# Patient Record
Sex: Female | Born: 2005 | Hispanic: Yes | Marital: Single | Smoking: Never smoker
Health system: Southern US, Community
[De-identification: ages and names within clinical notes are randomized; demographics above are authoritative.]

## PROBLEM LIST (undated history)

## (undated) DIAGNOSIS — Z558 Other problems related to education and literacy: Secondary | ICD-10-CM

## (undated) DIAGNOSIS — Q249 Congenital malformation of heart, unspecified: Secondary | ICD-10-CM

## (undated) DIAGNOSIS — F909 Attention-deficit hyperactivity disorder, unspecified type: Secondary | ICD-10-CM

## (undated) HISTORY — DX: Congenital malformation of heart, unspecified: Q24.9

## (undated) HISTORY — DX: Attention-deficit hyperactivity disorder, unspecified type: F90.9

## (undated) HISTORY — DX: Other problems related to education and literacy: Z55.8

## (undated) HISTORY — PX: CARDIAC SURGERY: SHX584

---
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
## 2024-03-05 DIAGNOSIS — Z23 Encounter for immunization: Secondary | ICD-10-CM | POA: Diagnosis not present

## 2024-03-05 DIAGNOSIS — Z0000 Encounter for general adult medical examination without abnormal findings: Secondary | ICD-10-CM | POA: Diagnosis not present

## 2024-03-05 DIAGNOSIS — Z68.41 Body mass index (BMI) pediatric, 120% of the 95th percentile for age to less than 140% of the 95th percentile for age: Secondary | ICD-10-CM | POA: Diagnosis not present

## 2024-03-05 DIAGNOSIS — E66812 Obesity, class 2: Secondary | ICD-10-CM | POA: Diagnosis not present
